# Patient Record
Sex: Male | Born: 1950 | Race: White | Hispanic: No | Marital: Married | Smoking: Former smoker
Health system: Southern US, Community
[De-identification: ages and names within clinical notes are randomized; demographics above are authoritative.]

## PROBLEM LIST (undated history)

## (undated) DIAGNOSIS — E785 Hyperlipidemia, unspecified: Secondary | ICD-10-CM

## (undated) DIAGNOSIS — G4730 Sleep apnea, unspecified: Secondary | ICD-10-CM

## (undated) DIAGNOSIS — I252 Old myocardial infarction: Secondary | ICD-10-CM

## (undated) DIAGNOSIS — I10 Essential (primary) hypertension: Secondary | ICD-10-CM

## (undated) DIAGNOSIS — E119 Type 2 diabetes mellitus without complications: Secondary | ICD-10-CM

## (undated) DIAGNOSIS — L089 Local infection of the skin and subcutaneous tissue, unspecified: Secondary | ICD-10-CM

## (undated) DIAGNOSIS — B958 Unspecified staphylococcus as the cause of diseases classified elsewhere: Secondary | ICD-10-CM

## (undated) DIAGNOSIS — K219 Gastro-esophageal reflux disease without esophagitis: Secondary | ICD-10-CM

## (undated) DIAGNOSIS — M1990 Unspecified osteoarthritis, unspecified site: Secondary | ICD-10-CM

## (undated) HISTORY — PX: APPENDECTOMY: SHX54

## (undated) HISTORY — PX: SHOULDER SURGERY: SHX246

## (undated) HISTORY — PX: INGUINAL HERNIA REPAIR: SUR1180

## (undated) HISTORY — PX: KNEE SURGERY: SHX244

---

## 2017-07-14 ENCOUNTER — Emergency Department
Admission: EM | Admit: 2017-07-14 | Discharge: 2017-07-14 | Disposition: A | Discharge status: Home / Self Care | Payer: Medicare Other | Attending: Emergency Medicine | Admitting: Emergency Medicine

## 2017-07-14 ENCOUNTER — Encounter: Payer: Self-pay | Admitting: Emergency Medicine

## 2017-07-14 ENCOUNTER — Emergency Department: Payer: Medicare Other

## 2017-07-14 DIAGNOSIS — Z87891 Personal history of nicotine dependence: Secondary | ICD-10-CM | POA: Diagnosis not present

## 2017-07-14 DIAGNOSIS — I10 Essential (primary) hypertension: Secondary | ICD-10-CM | POA: Diagnosis not present

## 2017-07-14 DIAGNOSIS — M79602 Pain in left arm: Secondary | ICD-10-CM | POA: Insufficient documentation

## 2017-07-14 DIAGNOSIS — E119 Type 2 diabetes mellitus without complications: Secondary | ICD-10-CM | POA: Diagnosis not present

## 2017-07-14 DIAGNOSIS — R200 Anesthesia of skin: Secondary | ICD-10-CM | POA: Diagnosis not present

## 2017-07-14 DIAGNOSIS — R079 Chest pain, unspecified: Secondary | ICD-10-CM | POA: Insufficient documentation

## 2017-07-14 DIAGNOSIS — G4733 Obstructive sleep apnea (adult) (pediatric): Secondary | ICD-10-CM | POA: Insufficient documentation

## 2017-07-14 DIAGNOSIS — G4730 Sleep apnea, unspecified: Secondary | ICD-10-CM

## 2017-07-14 HISTORY — DX: Sleep apnea, unspecified: G47.30

## 2017-07-14 HISTORY — DX: Type 2 diabetes mellitus without complications: E11.9

## 2017-07-14 HISTORY — DX: Gastro-esophageal reflux disease without esophagitis: K21.9

## 2017-07-14 HISTORY — DX: Hyperlipidemia, unspecified: E78.5

## 2017-07-14 HISTORY — DX: Old myocardial infarction: I25.2

## 2017-07-14 HISTORY — DX: Unspecified staphylococcus as the cause of diseases classified elsewhere: B95.8

## 2017-07-14 HISTORY — DX: Unspecified staphylococcus as the cause of diseases classified elsewhere: L08.9

## 2017-07-14 HISTORY — DX: Essential (primary) hypertension: I10

## 2017-07-14 HISTORY — DX: Unspecified osteoarthritis, unspecified site: M19.90

## 2017-07-14 LAB — HEPATIC FUNCTION PANEL
ALBUMIN: 3.7 g/dL (ref 3.5–5.0)
ALT: 47 U/L (ref 17–63)
AST: 36 U/L (ref 15–41)
Alkaline Phosphatase: 97 U/L (ref 38–126)
Bilirubin, Direct: <0.1 mg/dL — ABNORMAL LOW (ref 0.1–0.5)
Total Bilirubin: 0.3 mg/dL (ref 0.3–1.2)
Total Protein: 6.9 g/dL (ref 6.5–8.1)

## 2017-07-14 LAB — BASIC METABOLIC PANEL
ANION GAP: 9 (ref 5–15)
BUN: 18 mg/dL (ref 6–20)
CALCIUM: 8.9 mg/dL (ref 8.9–10.3)
CO2: 25 mmol/L (ref 22–32)
Chloride: 101 mmol/L (ref 101–111)
Creatinine, Ser: 0.84 mg/dL (ref 0.61–1.24)
Glucose, Bld: 135 mg/dL — ABNORMAL HIGH (ref 65–99)
Potassium: 3.9 mmol/L (ref 3.5–5.1)
Sodium: 135 mmol/L (ref 135–145)

## 2017-07-14 LAB — TROPONIN I: Troponin I: <0.03 ng/mL (ref <0.03)

## 2017-07-14 LAB — CBC
HCT: 42.9 % (ref 40.0–52.0)
HEMOGLOBIN: 14.4 g/dL (ref 13.0–18.0)
MCH: 29.2 pg (ref 26.0–34.0)
MCHC: 33.7 g/dL (ref 32.0–36.0)
MCV: 86.8 fL (ref 80.0–100.0)
Platelets: 306 10*3/uL (ref 150–440)
RBC: 4.93 MIL/uL (ref 4.40–5.90)
RDW: 14.5 % (ref 11.5–14.5)
WBC: 9.8 10*3/uL (ref 3.8–10.6)

## 2017-07-14 LAB — BRAIN NATRIURETIC PEPTIDE: B Natriuretic Peptide: 14 pg/mL (ref 0.0–100.0)

## 2017-07-14 MED ORDER — FUROSEMIDE 40 MG PO TABS
20.0000 mg | ORAL_TABLET | Freq: Once | ORAL | Status: AC
  Administered 2017-07-14: 20 mg via ORAL

## 2017-07-14 MED ORDER — ASPIRIN 81 MG PO CHEW
CHEWABLE_TABLET | ORAL | Status: AC
  Filled 2017-07-14: qty 1

## 2017-07-14 MED ORDER — ASPIRIN 81 MG PO CHEW
324.0000 mg | CHEWABLE_TABLET | Freq: Once | ORAL | Status: AC
  Administered 2017-07-14: 324 mg via ORAL
  Filled 2017-07-14: qty 4

## 2017-07-14 MED ORDER — FUROSEMIDE 40 MG PO TABS
ORAL_TABLET | ORAL | Status: AC
  Filled 2017-07-14: qty 1

## 2017-07-14 NOTE — ED Notes (Signed)
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
## 2017-07-14 NOTE — ED Notes (Signed)
Report to Allison, RN

## 2017-07-14 NOTE — Discharge Instructions (Addendum)
please follow-[facility] with [facility]. They should call you in the morning with an appointment hopefully tomorrow. I would keep your appointment with your cardiologist in [place] as well. Please return for worsening chest pain or shortness of breath. Please return if you have syncope while you're exercising or moving. Please let your son or other family members or friends drive for you for the time being until you get your sleep apnea under control.
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
## 2017-07-14 NOTE — ED Notes (Signed)
Pt arrives with a service dog , " it helps me with getting thing"

## 2017-07-15 ENCOUNTER — Encounter: Payer: Self-pay | Admitting: Physician Assistant

## 2017-07-15 ENCOUNTER — Ambulatory Visit: Payer: Medicare Other | Admitting: Nurse Practitioner

## 2017-07-15 ENCOUNTER — Telehealth: Payer: Self-pay

## 2017-07-15 NOTE — Progress Notes (Addendum)
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 Dr. Antoine PocheHochrein requested this patient be scheduled [facility]. He spoke to ER yesterday. Ward Givenshris Berge agreed to add on patient for 2:30 today. See telephone note - when patient was called for appt information, he declined appt and has plans to f/u with cardiology in [place] tomorrow. Dr. Antoine PocheHochrein made aware. Dayna Dunn PA-C

## 2017-07-15 NOTE — Telephone Encounter (Signed)
Per [facility] at [facility] scheduled patient to see Brion AlimentBerge in office at 230 pm for urgent new patient appt.   Called patient .  He declined to be seen today he states he has an appointment with cardio tomorrow in [place] and doesnt have a ride to come to office today   Appt cancelled

## 2018-09-23 IMAGING — CR DG CHEST 2V
1 series · 2 of 2 positions shown · non-contrast
Comparison: None.

CLINICAL DATA: Syncopal episode.

EXAM:
CHEST - 2 VIEW

[Series 1: w chest pa · 0.14mm/px · 2 of 2 slices shown]
[im 1/2]
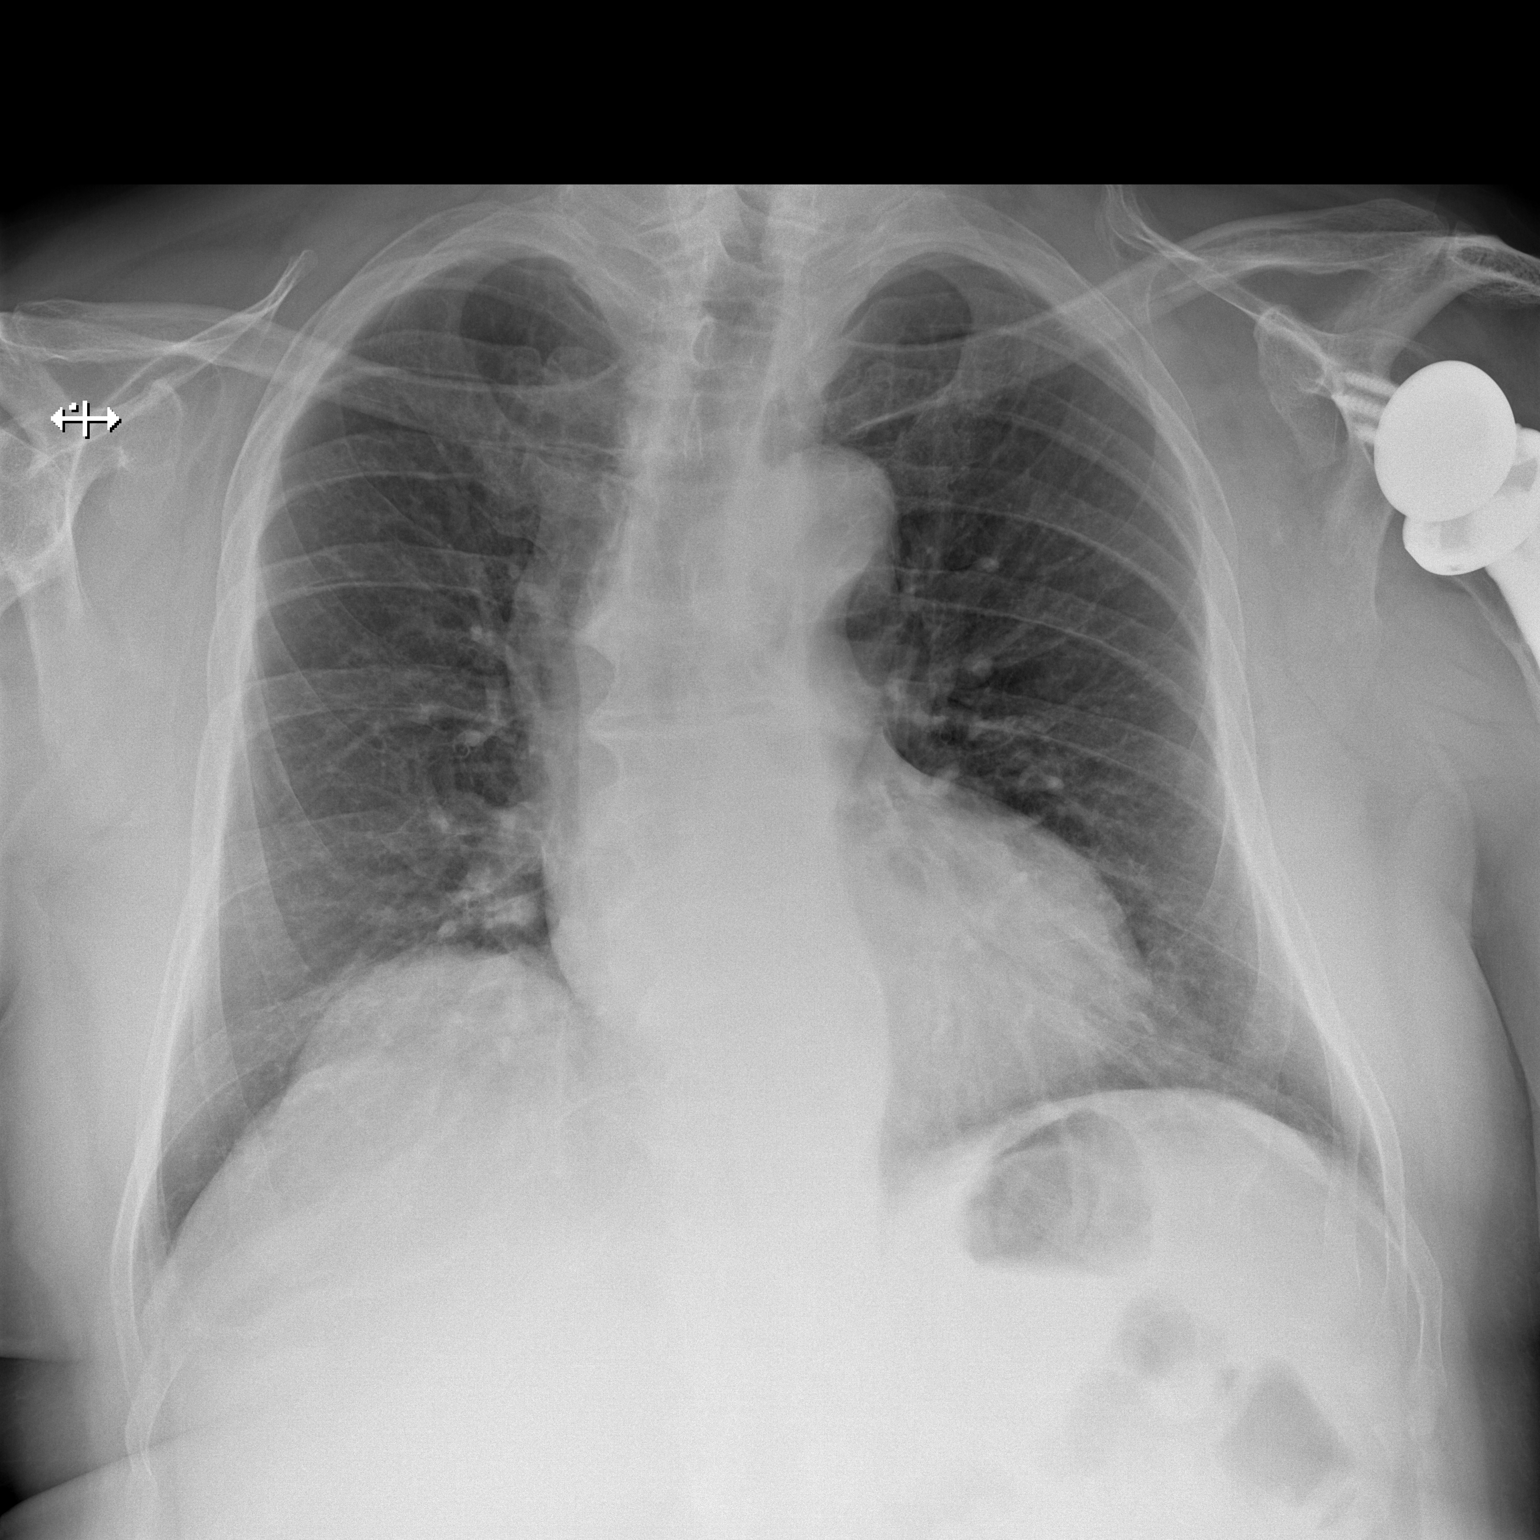
[im 2/2]
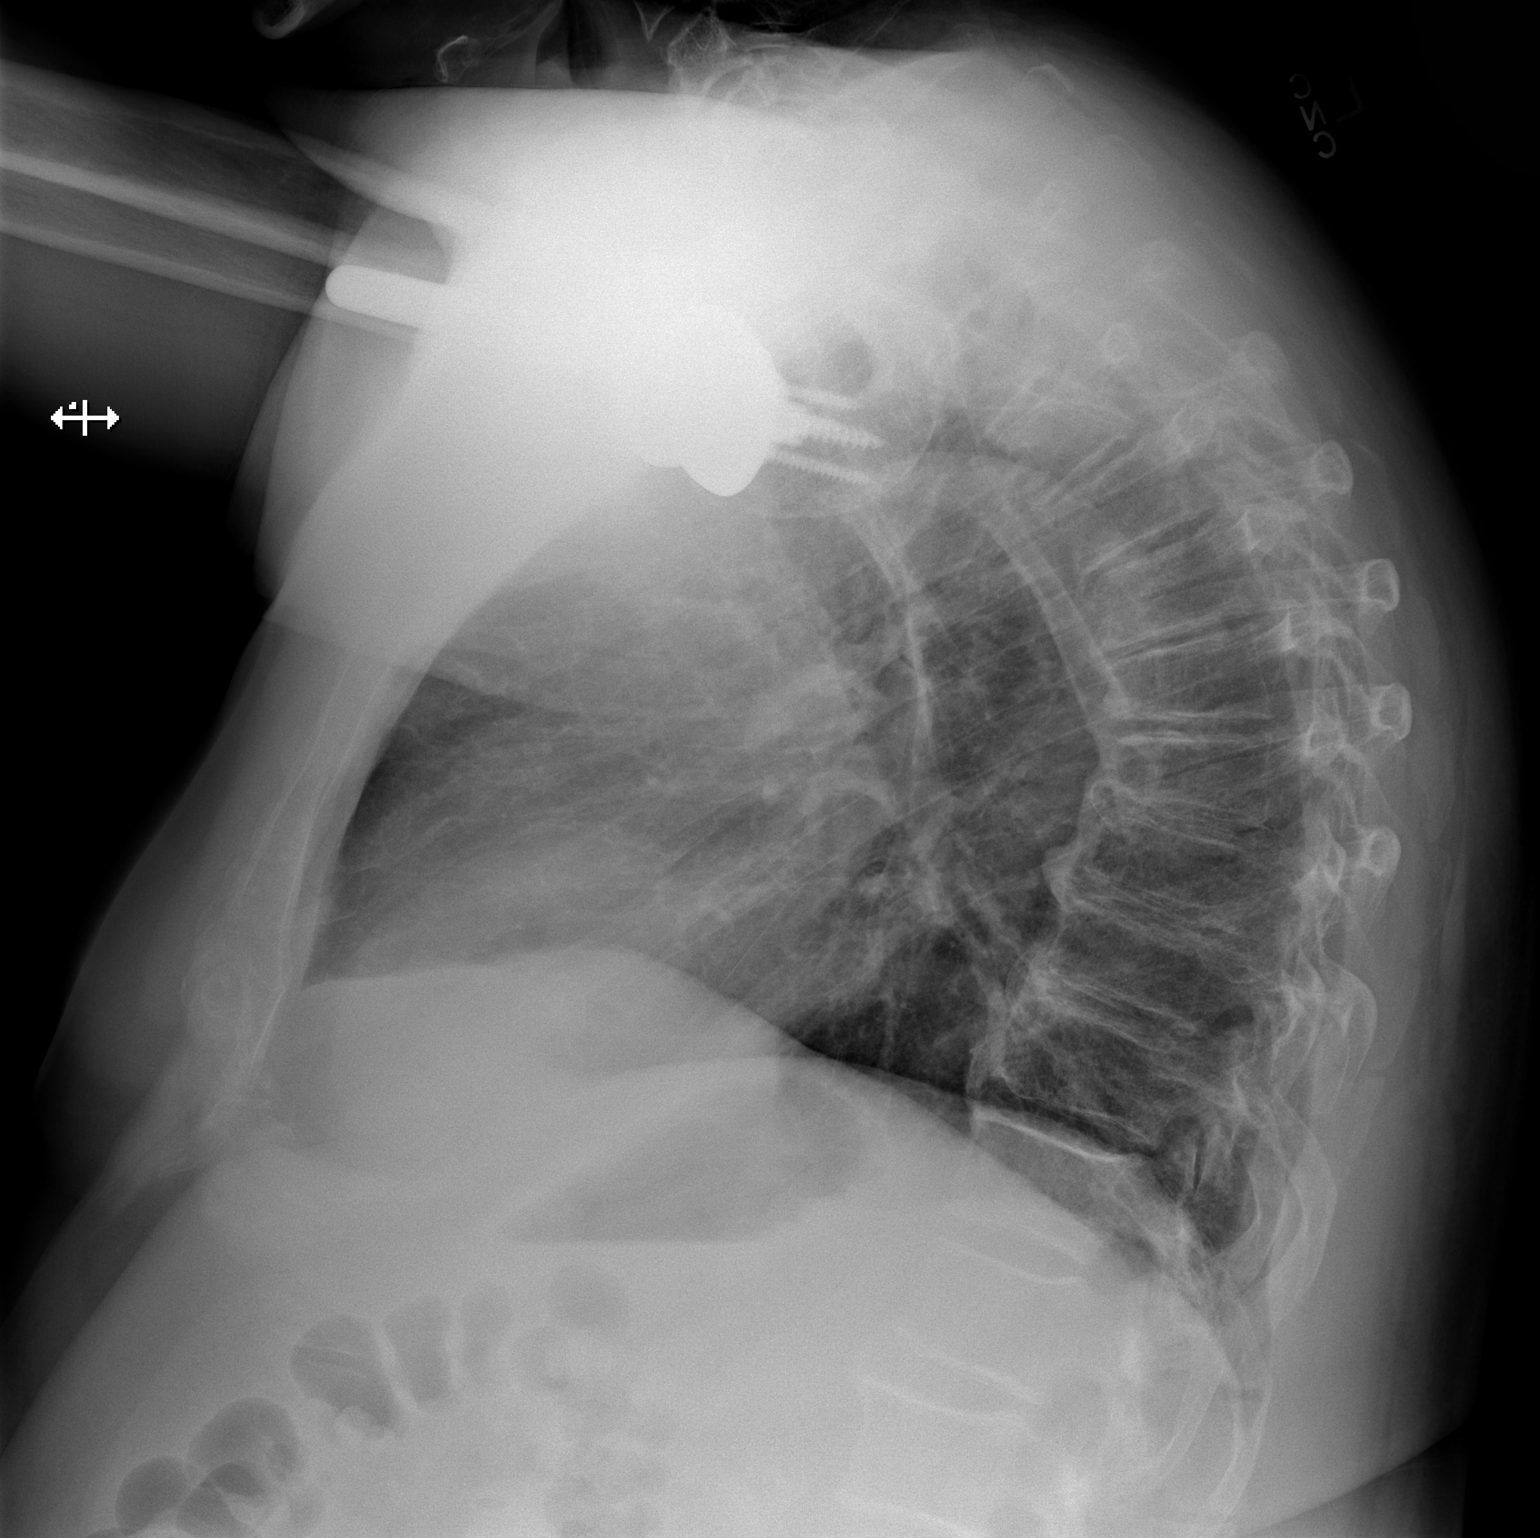

[2 of 2 positions shown; findings below may reference images not displayed]

FINDINGS: The cardiac silhouette, mediastinal and hilar contours are within
normal limits. There is moderate tortuosity and mild calcification
of the thoracic aorta. The pulmonary hila appear normal. There is
eventration right hemidiaphragm. No infiltrates or effusions.

Exaggerated thoracic kyphosis due to midthoracic compression
deformities. A reversed left total shoulder arthroplasty is noted.
IMPRESSION: No acute cardiopulmonary findings.
